# Patient Record
Sex: Female | Born: 1985 | Race: Black or African American | Hispanic: No | Marital: Single | Smoking: Current every day smoker
Health system: Southern US, Community
[De-identification: ages and names within clinical notes are randomized; demographics above are authoritative.]

---
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
## 2007-03-22 ENCOUNTER — Emergency Department (HOSPITAL_COMMUNITY): Admission: EM | Admit: 2007-03-22 | Discharge: 2007-03-23 | Payer: Self-pay | Admitting: Emergency Medicine

## 2007-07-26 ENCOUNTER — Ambulatory Visit (HOSPITAL_COMMUNITY): Admission: RE | Admit: 2007-07-26 | Discharge: 2007-07-26 | Payer: Self-pay | Admitting: Obstetrics & Gynecology

## 2007-09-20 ENCOUNTER — Ambulatory Visit (HOSPITAL_COMMUNITY): Admission: RE | Admit: 2007-09-20 | Discharge: 2007-09-20 | Payer: Self-pay | Admitting: Family Medicine

## 2007-09-29 ENCOUNTER — Inpatient Hospital Stay (HOSPITAL_COMMUNITY): Admission: AD | Admit: 2007-09-29 | Discharge: 2007-10-01 | Payer: Self-pay | Admitting: Gynecology

## 2007-09-29 ENCOUNTER — Ambulatory Visit: Payer: Self-pay | Admitting: Obstetrics and Gynecology

## 2008-09-17 ENCOUNTER — Emergency Department (HOSPITAL_COMMUNITY): Admission: EM | Admit: 2008-09-17 | Discharge: 2008-09-18 | Payer: Self-pay | Admitting: Emergency Medicine

## 2009-03-23 ENCOUNTER — Inpatient Hospital Stay (HOSPITAL_COMMUNITY): Admission: AD | Admit: 2009-03-23 | Discharge: 2009-03-23 | Payer: Self-pay | Admitting: Obstetrics & Gynecology

## 2009-07-05 ENCOUNTER — Inpatient Hospital Stay (HOSPITAL_COMMUNITY): Admission: AD | Admit: 2009-07-05 | Discharge: 2009-07-05 | Payer: Self-pay | Admitting: Obstetrics & Gynecology

## 2009-08-06 ENCOUNTER — Ambulatory Visit (HOSPITAL_COMMUNITY): Admission: RE | Admit: 2009-08-06 | Discharge: 2009-08-06 | Payer: Self-pay | Admitting: Family Medicine

## 2009-08-27 ENCOUNTER — Ambulatory Visit (HOSPITAL_COMMUNITY): Admission: RE | Admit: 2009-08-27 | Discharge: 2009-08-27 | Payer: Self-pay | Admitting: Obstetrics & Gynecology

## 2009-10-24 ENCOUNTER — Inpatient Hospital Stay (HOSPITAL_COMMUNITY): Admission: AD | Admit: 2009-10-24 | Discharge: 2009-10-24 | Payer: Self-pay | Admitting: Obstetrics & Gynecology

## 2009-10-24 ENCOUNTER — Ambulatory Visit: Payer: Self-pay | Admitting: Obstetrics and Gynecology

## 2009-11-03 ENCOUNTER — Inpatient Hospital Stay (HOSPITAL_COMMUNITY): Admission: AD | Admit: 2009-11-03 | Discharge: 2009-11-05 | Payer: Self-pay | Admitting: Obstetrics & Gynecology

## 2009-11-03 ENCOUNTER — Ambulatory Visit: Payer: Self-pay | Admitting: Family Medicine

## 2010-09-01 ENCOUNTER — Emergency Department (HOSPITAL_COMMUNITY): Admission: EM | Admit: 2010-09-01 | Discharge: 2010-09-01 | Payer: Self-pay | Admitting: Emergency Medicine

## 2010-11-17 ENCOUNTER — Emergency Department (HOSPITAL_COMMUNITY): Admission: EM | Admit: 2010-11-17 | Discharge: 2010-10-03 | Payer: Self-pay | Admitting: Emergency Medicine

## 2011-02-22 LAB — POCT PREGNANCY, URINE: Preg Test, Ur: NEGATIVE

## 2011-02-22 LAB — URINALYSIS, ROUTINE W REFLEX MICROSCOPIC
Bilirubin Urine: NEGATIVE
Glucose, UA: NEGATIVE mg/dL
Hgb urine dipstick: NEGATIVE
Ketones, ur: NEGATIVE mg/dL
Nitrite: NEGATIVE
Protein, ur: NEGATIVE mg/dL
Specific Gravity, Urine: 1.017 (ref 1.005–1.030)
Urobilinogen, UA: 1 mg/dL (ref 0.0–1.0)
pH: 5.5 (ref 5.0–8.0)

## 2011-02-22 LAB — RPR: RPR Ser Ql: NONREACTIVE

## 2011-02-22 LAB — GC/CHLAMYDIA PROBE AMP, GENITAL
Chlamydia, DNA Probe: NEGATIVE
GC Probe Amp, Genital: NEGATIVE

## 2011-02-22 LAB — WET PREP, GENITAL: Yeast Wet Prep HPF POC: NONE SEEN

## 2011-02-23 LAB — HEPATIC FUNCTION PANEL
ALT: 21 U/L (ref 0–35)
AST: 21 U/L (ref 0–37)
Bilirubin, Direct: 0.2 mg/dL (ref 0.0–0.3)
Indirect Bilirubin: 0.4 mg/dL (ref 0.3–0.9)
Total Protein: 7.5 g/dL (ref 6.0–8.3)

## 2011-02-23 LAB — DIFFERENTIAL
Basophils Absolute: 0 10*3/uL (ref 0.0–0.1)
Basophils Relative: 1 % (ref 0–1)
Eosinophils Absolute: 0.1 10*3/uL (ref 0.0–0.7)
Eosinophils Relative: 2 % (ref 0–5)
Lymphocytes Relative: 39 % (ref 12–46)
Lymphs Abs: 2 10*3/uL (ref 0.7–4.0)
Monocytes Absolute: 0.3 10*3/uL (ref 0.1–1.0)
Monocytes Relative: 5 % (ref 3–12)
Neutro Abs: 2.8 10*3/uL (ref 1.7–7.7)
Neutrophils Relative %: 53 % (ref 43–77)

## 2011-02-23 LAB — CBC
HCT: 39.6 % (ref 36.0–46.0)
Hemoglobin: 13.3 g/dL (ref 12.0–15.0)
MCH: 29.1 pg (ref 26.0–34.0)
MCHC: 33.6 g/dL (ref 30.0–36.0)
MCV: 86.7 fL (ref 78.0–100.0)
Platelets: 224 10*3/uL (ref 150–400)
RBC: 4.57 MIL/uL (ref 3.87–5.11)
RDW: 13.2 % (ref 11.5–15.5)
WBC: 5.2 10*3/uL (ref 4.0–10.5)

## 2011-02-23 LAB — BASIC METABOLIC PANEL
BUN: 10 mg/dL (ref 6–23)
CO2: 28 mEq/L (ref 19–32)
Calcium: 9.5 mg/dL (ref 8.4–10.5)
Chloride: 106 mEq/L (ref 96–112)
Creatinine, Ser: 0.65 mg/dL (ref 0.4–1.2)
GFR calc Af Amer: >60 mL/min (ref >60)
GFR calc non Af Amer: >60 mL/min (ref >60)
Glucose, Bld: 85 mg/dL (ref 70–99)
Potassium: 3.9 mEq/L (ref 3.5–5.1)
Sodium: 140 mEq/L (ref 135–145)

## 2011-02-23 LAB — URINE MICROSCOPIC-ADD ON

## 2011-02-23 LAB — URINALYSIS, ROUTINE W REFLEX MICROSCOPIC
Bilirubin Urine: NEGATIVE
Glucose, UA: NEGATIVE mg/dL
Hgb urine dipstick: NEGATIVE
Ketones, ur: NEGATIVE mg/dL
Nitrite: NEGATIVE
Protein, ur: NEGATIVE mg/dL
Specific Gravity, Urine: 1.027 (ref 1.005–1.030)
Urobilinogen, UA: 1 mg/dL (ref 0.0–1.0)
pH: 5.5 (ref 5.0–8.0)

## 2011-02-23 LAB — POCT PREGNANCY, URINE: Preg Test, Ur: NEGATIVE

## 2011-03-15 LAB — WET PREP, GENITAL
Clue Cells Wet Prep HPF POC: NONE SEEN
Trich, Wet Prep: NONE SEEN

## 2011-03-15 LAB — CBC
MCHC: 33.3 g/dL (ref 30.0–36.0)
RBC: 4.25 MIL/uL (ref 3.87–5.11)
RDW: 14.4 % (ref 11.5–15.5)

## 2011-03-15 LAB — RPR: RPR Ser Ql: NONREACTIVE

## 2011-03-15 LAB — RAPID HIV SCREEN (WH-MAU): Rapid HIV Screen: NONREACTIVE

## 2011-03-19 LAB — URINALYSIS, ROUTINE W REFLEX MICROSCOPIC
Bilirubin Urine: NEGATIVE
Ketones, ur: NEGATIVE mg/dL
Nitrite: NEGATIVE
Protein, ur: NEGATIVE mg/dL
Urobilinogen, UA: 0.2 mg/dL (ref 0.0–1.0)

## 2011-03-19 LAB — GC/CHLAMYDIA PROBE AMP, GENITAL: Chlamydia, DNA Probe: POSITIVE — AB

## 2011-03-22 LAB — URINALYSIS, ROUTINE W REFLEX MICROSCOPIC
Glucose, UA: NEGATIVE mg/dL
Ketones, ur: NEGATIVE mg/dL
Nitrite: NEGATIVE
Specific Gravity, Urine: >1.03 — ABNORMAL HIGH (ref 1.005–1.030)
pH: 6 (ref 5.0–8.0)

## 2011-03-22 LAB — WET PREP, GENITAL: Yeast Wet Prep HPF POC: NONE SEEN

## 2011-03-22 LAB — RPR: RPR Ser Ql: NONREACTIVE

## 2011-03-22 LAB — CBC
Hemoglobin: 12.1 g/dL (ref 12.0–15.0)
RBC: 3.97 MIL/uL (ref 3.87–5.11)
RDW: 12.6 % (ref 11.5–15.5)
WBC: 4.9 10*3/uL (ref 4.0–10.5)

## 2011-06-09 IMAGING — US US OB DETAIL+14 WK
2 series · 14 of 28 positions shown · non-contrast
Comparison: none

OBSTETRICAL ULTRASOUND:
 This ultrasound exam was performed in the [HOSPITAL] Ultrasound Department.  The OB US report was generated in the AS system, and faxed to the ordering physician.  This report is also available in [REDACTED] PACS.

[Series 1: us ob detail +14 wk · 1 of 7 slices shown (1 of 2)]
[im 7/7]
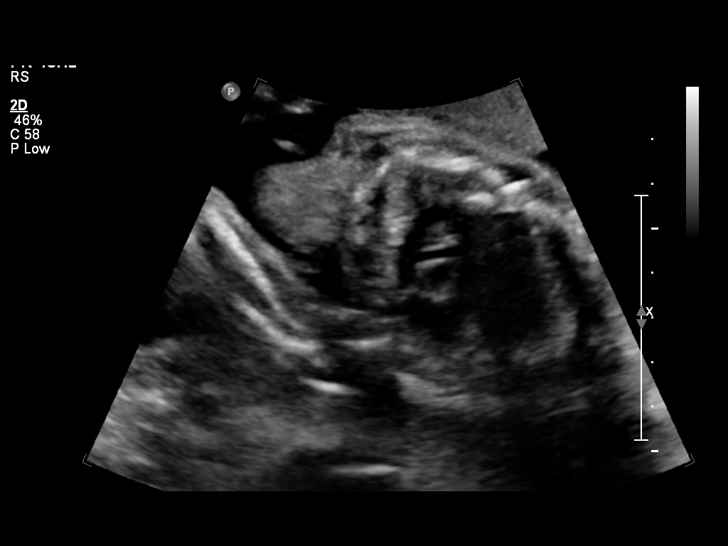

[Series 1: us ob detail +14 wk · 13 of 74 slices shown (2 of 2)]
[im 3/74]
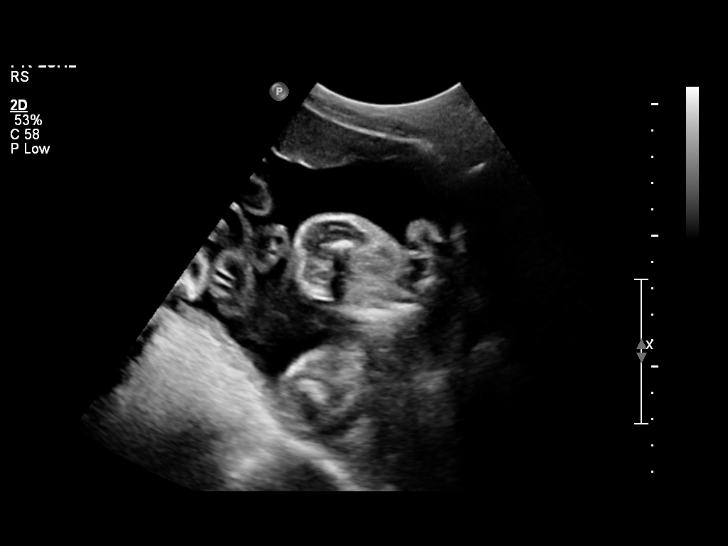
[im 9/74]
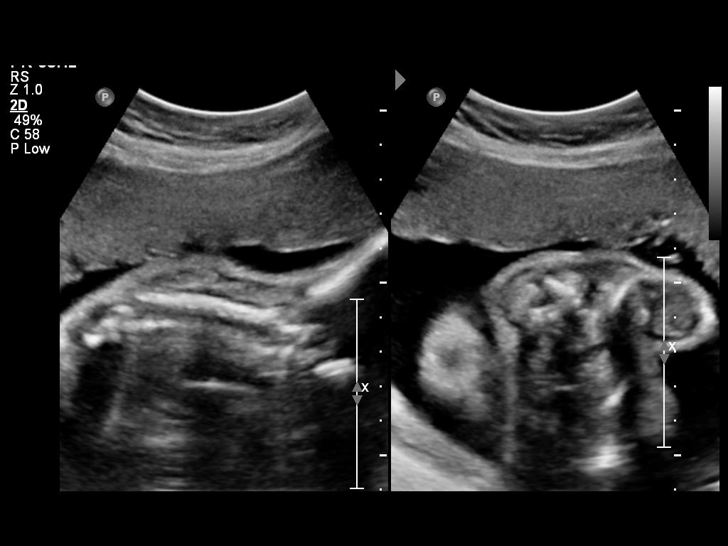
[im 15/74]
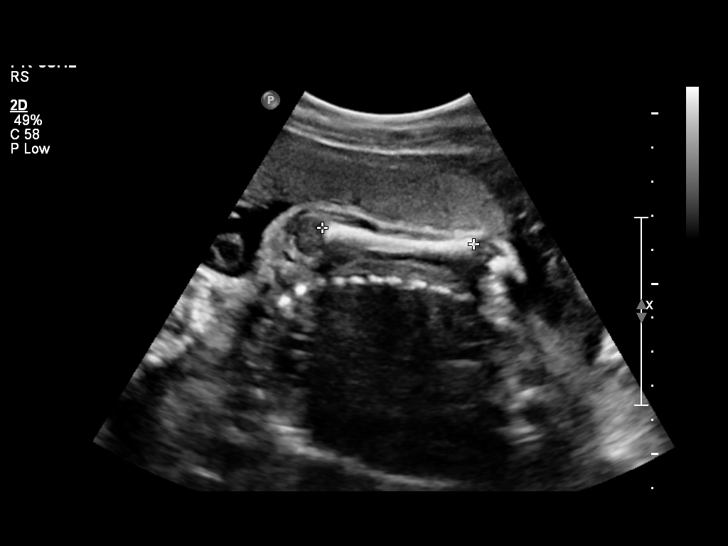
[im 21/74]
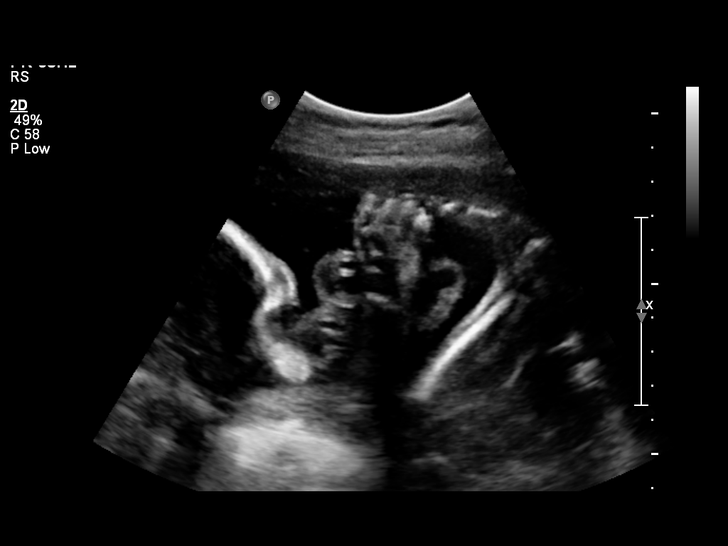
[im 27/74]
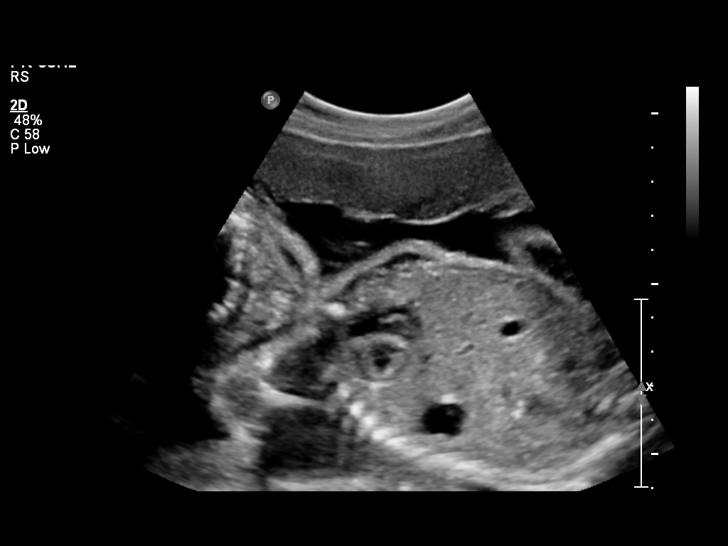
[im 33/74]
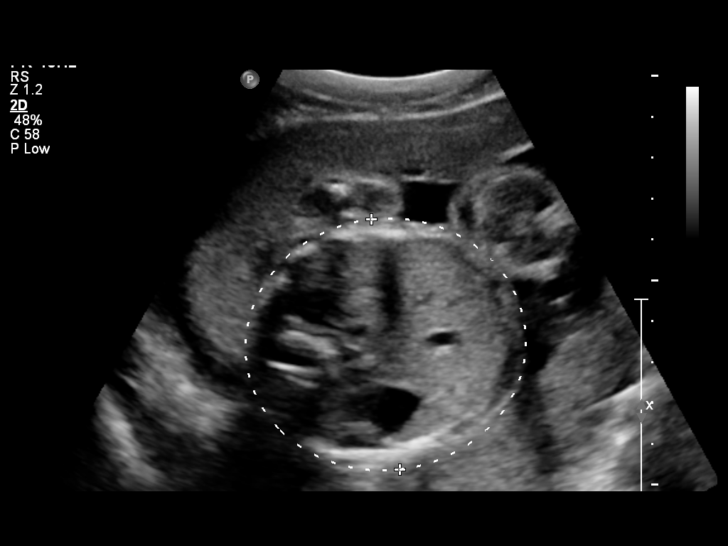
[im 38/74]
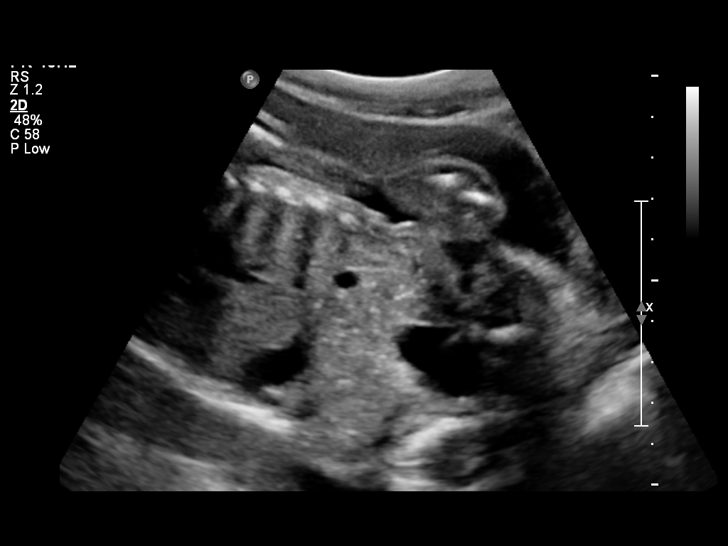
[im 44/74]
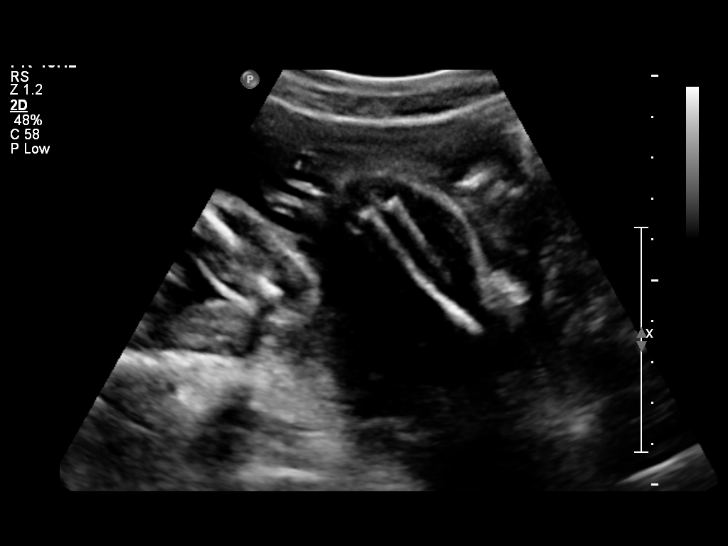
[im 50/74]
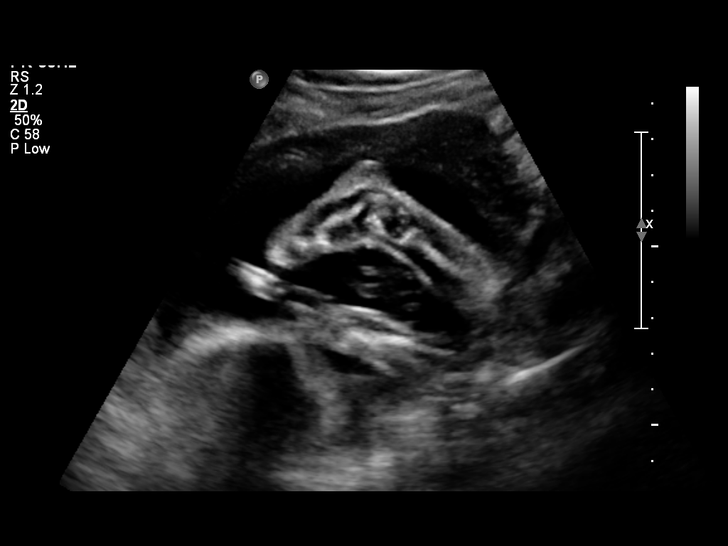
[im 56/74]
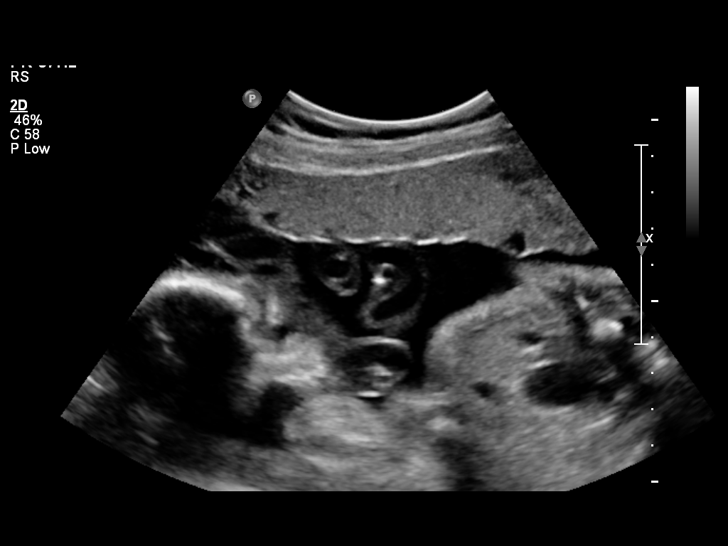
[im 62/74]
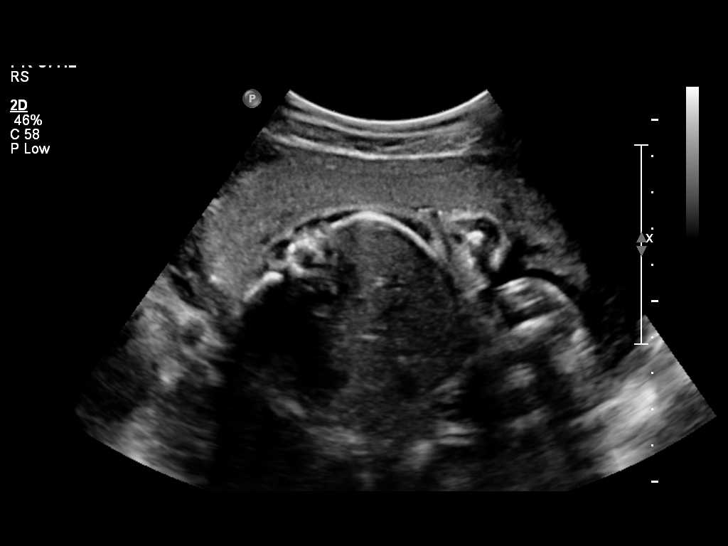
[im 68/74]
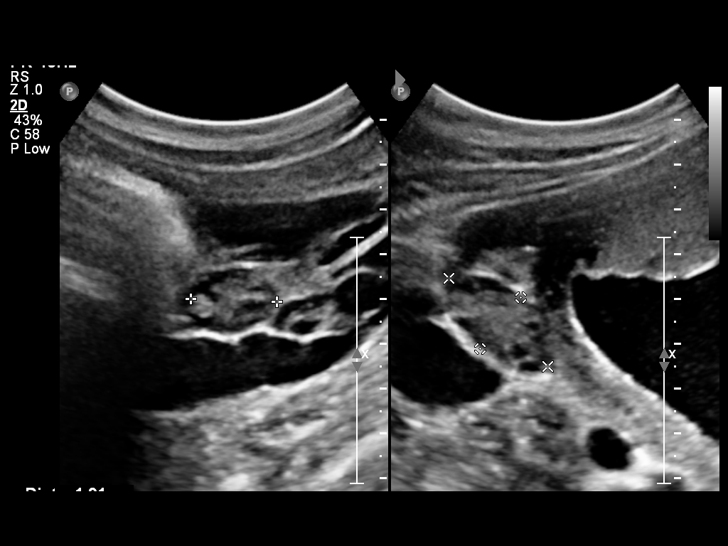
[im 74/74]
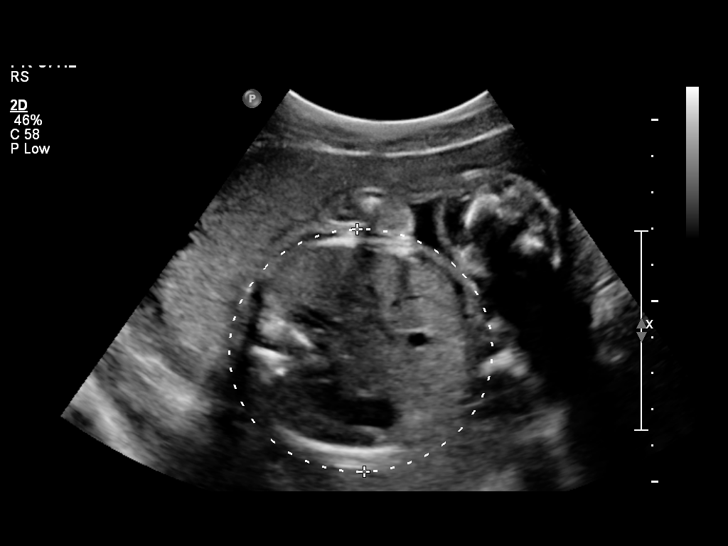

[14 of 28 positions shown; findings below may reference images not displayed]

IMPRESSION: See AS Obstetric US report.

## 2011-09-12 LAB — URINALYSIS, ROUTINE W REFLEX MICROSCOPIC
Bilirubin Urine: NEGATIVE
Glucose, UA: NEGATIVE
Nitrite: NEGATIVE
Specific Gravity, Urine: 1.028
pH: 5.5

## 2011-09-12 LAB — POCT PREGNANCY, URINE: Preg Test, Ur: NEGATIVE

## 2011-09-20 LAB — RAPID URINE DRUG SCREEN, HOSP PERFORMED
Barbiturates: NOT DETECTED
Benzodiazepines: NOT DETECTED
Cocaine: NOT DETECTED

## 2011-09-20 LAB — CBC
Hemoglobin: 12.8
MCHC: 33.7
MCHC: 34.7
MCV: 88
Platelets: 200
RBC: 4.24
RDW: 14

## 2011-09-20 LAB — CCBB MATERNAL DONOR DRAW

## 2011-09-20 LAB — RPR: RPR Ser Ql: NONREACTIVE

## 2011-11-27 ENCOUNTER — Emergency Department (HOSPITAL_COMMUNITY)
Admission: EM | Admit: 2011-11-27 | Discharge: 2011-11-28 | Disposition: A | Discharge status: Home / Self Care | Payer: Medicaid Other | Attending: Emergency Medicine | Admitting: Emergency Medicine

## 2011-11-27 ENCOUNTER — Encounter: Payer: Self-pay | Admitting: Emergency Medicine

## 2011-11-27 DIAGNOSIS — R109 Unspecified abdominal pain: Secondary | ICD-10-CM | POA: Insufficient documentation

## 2011-11-27 DIAGNOSIS — R0609 Other forms of dyspnea: Secondary | ICD-10-CM | POA: Insufficient documentation

## 2011-11-27 DIAGNOSIS — N949 Unspecified condition associated with female genital organs and menstrual cycle: Secondary | ICD-10-CM | POA: Insufficient documentation

## 2011-11-27 DIAGNOSIS — R10819 Abdominal tenderness, unspecified site: Secondary | ICD-10-CM | POA: Insufficient documentation

## 2011-11-27 DIAGNOSIS — R300 Dysuria: Secondary | ICD-10-CM | POA: Insufficient documentation

## 2011-11-27 DIAGNOSIS — A5901 Trichomonal vulvovaginitis: Secondary | ICD-10-CM | POA: Insufficient documentation

## 2011-11-27 DIAGNOSIS — R350 Frequency of micturition: Secondary | ICD-10-CM | POA: Insufficient documentation

## 2011-11-27 DIAGNOSIS — R3915 Urgency of urination: Secondary | ICD-10-CM | POA: Insufficient documentation

## 2011-11-27 DIAGNOSIS — R0989 Other specified symptoms and signs involving the circulatory and respiratory systems: Secondary | ICD-10-CM | POA: Insufficient documentation

## 2011-11-27 DIAGNOSIS — N72 Inflammatory disease of cervix uteri: Secondary | ICD-10-CM | POA: Insufficient documentation

## 2011-11-27 LAB — DIFFERENTIAL
Eosinophils Absolute: 0.2 10*3/uL (ref 0.0–0.7)
Eosinophils Relative: 3 % (ref 0–5)
Lymphs Abs: 2.7 10*3/uL (ref 0.7–4.0)
Monocytes Absolute: 0.4 10*3/uL (ref 0.1–1.0)
Monocytes Relative: 6 % (ref 3–12)

## 2011-11-27 LAB — BASIC METABOLIC PANEL
BUN: 14 mg/dL (ref 6–23)
Calcium: 8.8 mg/dL (ref 8.4–10.5)
Creatinine, Ser: 0.66 mg/dL (ref 0.50–1.10)
GFR calc non Af Amer: >90 mL/min (ref >90)
Glucose, Bld: 77 mg/dL (ref 70–99)

## 2011-11-27 LAB — URINALYSIS, ROUTINE W REFLEX MICROSCOPIC
Bilirubin Urine: NEGATIVE
Hgb urine dipstick: NEGATIVE
Protein, ur: NEGATIVE mg/dL
Urobilinogen, UA: 1 mg/dL (ref 0.0–1.0)

## 2011-11-27 LAB — CBC
MCHC: 33.8 g/dL (ref 30.0–36.0)
RDW: 13.1 % (ref 11.5–15.5)

## 2011-11-27 LAB — URINE MICROSCOPIC-ADD ON

## 2011-11-27 NOTE — ED Notes (Addendum)
Pt stated that she has been having abdominal cramping in lower and right side since 11/24/11. Pain score is 7 out of 10. No nausea or vomiting. Sexually active, but no pain during sex. No pain with urination. No fever. Bowel sounds present. Tenderness in all lower quadrants. Abdomen soft to touch. Pt stated that she was having vaginal itching  That stopped. Pt stated that she is now having vaginal discharge.

## 2011-11-27 NOTE — ED Notes (Signed)
PT. REPORTS LOW ABDOMINAL PAIN FOR 2 DAYS ,  DENIES VOMITTING OR DIARRHEA ,  NO FEVER OR CHILLS , DENIES DYSURIA.

## 2011-11-28 LAB — WET PREP, GENITAL: Clue Cells Wet Prep HPF POC: NONE SEEN

## 2011-11-28 MED ORDER — ONDANSETRON 4 MG PO TBDP
4.0000 mg | ORAL_TABLET | Freq: Once | ORAL | Status: AC
  Administered 2011-11-28: 4 mg via ORAL
  Filled 2011-11-28: qty 1

## 2011-11-28 MED ORDER — AZITHROMYCIN 250 MG PO TABS
1000.0000 mg | ORAL_TABLET | Freq: Once | ORAL | Status: AC
  Administered 2011-11-28: 1000 mg via ORAL
  Filled 2011-11-28: qty 4

## 2011-11-28 MED ORDER — FAMOTIDINE 20 MG PO TABS
20.0000 mg | ORAL_TABLET | Freq: Once | ORAL | Status: AC
  Administered 2011-11-28: 20 mg via ORAL

## 2011-11-28 MED ORDER — CEFTRIAXONE SODIUM 250 MG IJ SOLR
250.0000 mg | Freq: Once | INTRAMUSCULAR | Status: AC
  Administered 2011-11-28: 250 mg via INTRAMUSCULAR
  Filled 2011-11-28: qty 250

## 2011-11-28 MED ORDER — METRONIDAZOLE 500 MG PO TABS
2000.0000 mg | ORAL_TABLET | Freq: Once | ORAL | Status: AC
  Administered 2011-11-28: 2000 mg via ORAL
  Filled 2011-11-28: qty 4

## 2011-11-28 MED ORDER — LIDOCAINE HCL 2 % IJ SOLN
INTRAMUSCULAR | Status: AC
  Administered 2011-11-28: 1 mL via INTRAMUSCULAR
  Filled 2011-11-28: qty 1

## 2011-11-28 MED ORDER — FAMOTIDINE 20 MG PO TABS
ORAL_TABLET | ORAL | Status: AC
  Administered 2011-11-28: 20 mg via ORAL
  Filled 2011-11-28: qty 1

## 2011-11-28 NOTE — ED Notes (Signed)
Pt complaining that her lip was swollen after using the phone. NP Claris Gladden made aware with no new orders. Will continue to monitor.

## 2011-11-28 NOTE — ED Provider Notes (Signed)
History     CSN: [id number] Arrival date & time: 11/27/2011  9:19 PM   None     Chief Complaint  Patient presents with  . Abdominal Pain    (Consider location/radiation/quality/duration/timing/severity/associated sxs/prior treatment) HPI Comments: Patient with bilateral lower and suprapubic pain on and off for about one week.  Has a positive vaginal discharge and dysuria  Patient is a 25 y.o. female presenting with abdominal pain. The history is provided by the patient.  Abdominal Pain The primary symptoms of the illness include abdominal pain and vaginal discharge. The primary symptoms of the illness do not include nausea, vomiting, diarrhea or dysuria. The current episode started more than 2 days ago. The onset of the illness was gradual. The problem has been gradually worsening.  The vaginal discharge is not associated with dysuria.  Additional symptoms associated with the illness include urgency and frequency. Symptoms associated with the illness do not include chills, constipation or hematuria.    History reviewed. No pertinent past medical history.  History reviewed. No pertinent past surgical history.  No family history on file.  History  Substance Use Topics  . Smoking status: Never Smoker   . Smokeless tobacco: Not on file  . Alcohol Use: No    OB History    Grav Para Term Preterm Abortions TAB SAB Ect Mult Living                  Review of Systems  Constitutional: Negative for chills.  HENT: Negative.   Eyes: Negative.   Respiratory: Negative.   Gastrointestinal: Positive for abdominal pain. Negative for nausea, vomiting, diarrhea and constipation.  Genitourinary: Positive for urgency, frequency, vaginal discharge and vaginal pain. Negative for dysuria, hematuria, menstrual problem and pelvic pain.  Neurological: Negative.   Hematological: Negative.   Psychiatric/Behavioral: Negative.     Allergies  Review of patient's allergies indicates no known  allergies.  Home Medications   Current Outpatient Rx  Name Route Sig Dispense Refill  . NAPROXEN SODIUM 220 MG PO TABS Oral Take 220 mg by mouth once as needed. For pain.       BP 115/68  Pulse 71  Temp(Src) 98.3 F (36.8 C) (Oral)  Resp 18  SpO2 99%  LMP 10/26/2011  Physical Exam  Constitutional: She is oriented to person, place, and time. She appears well-developed and well-nourished.  HENT:  Head: Normocephalic.  Neck: Normal range of motion.  Cardiovascular: Normal rate.   Pulmonary/Chest: She is in respiratory distress.  Abdominal: There is tenderness in the suprapubic area. There is no CVA tenderness.  Genitourinary: There is tenderness around the vagina. Vaginal discharge found.  Musculoskeletal: Normal range of motion.  Neurological: She is oriented to person, place, and time.  Skin: Skin is warm.    ED Course  Procedures (including critical care time)  Labs Reviewed  BASIC METABOLIC PANEL - Abnormal; Notable for the following:    Sodium 134 (*)    Potassium 3.3 (*)    All other components within normal limits  URINALYSIS, ROUTINE W REFLEX MICROSCOPIC - Abnormal; Notable for the following:    Specific Gravity, Urine 1.038 (*)    Leukocytes, UA SMALL (*)    All other components within normal limits  URINE MICROSCOPIC-ADD ON - Abnormal; Notable for the following:    Squamous Epithelial / LPF FEW (*)    All other components within normal limits  WET PREP, GENITAL - Abnormal; Notable for the following:    Trich, Wet Prep FEW (*)  All other components within normal limits  CBC  DIFFERENTIAL  POCT PREGNANCY, URINE  POCT PREGNANCY, URINE  GC/CHLAMYDIA PROBE AMP, GENITAL   No results found.   1. Acute cervicitis   2. Trichomonal vaginitis     At time of discharge.  Patient reports on lips are swollen.  This happened before any of the antibiotics were administered.  She states she was fine and so she used them in the room and they noticed 8, numbness,  tingling to her right upper lip that is now spread across the entire upper lip and the right hand side of the lower lid minister, Pepcid, by mouth and observed for short period of time.  On exam.  The swelling appears to be isolated to the external lips.  No trouble swallowing.  No tachycardia  MDM  We'll perform a pelvic exam on exam, patient is cervical motion tenderness and bilateral adnexal tenderness.  Will treat for potential STD after review of wet prep.  She is positive for, trichomonas we'll treat for that as well        Arman Filter, NP 11/28/11 4540  Arman Filter, NP 11/28/11 9811  Arman Filter, NP 11/28/11 [phone]

## 2011-11-28 NOTE — ED Provider Notes (Signed)
Medical screening examination/treatment/procedure(s) were performed by non-physician practitioner and as supervising physician I was immediately available for consultation/collaboration.  Raeford Razor, MD 11/28/11 2251

## 2011-11-28 NOTE — ED Notes (Addendum)
Pt complained that her lip was slightly swollen after talking on the phone earlier. NP was aware and no new medications were given. Lip was slightly edematous. Pt was being discharged and lip was slightly edematous. Pt  Pt came back to stretcher triage, because NT noticed swollen. When Pt came back lip was 2x larger than it was when pt was discharge. NP made aware. Pepcid was given. Will continue to monitor.

## 2011-11-30 LAB — GC/CHLAMYDIA PROBE AMP, GENITAL
Chlamydia, DNA Probe: POSITIVE — AB
GC Probe Amp, Genital: NEGATIVE

## 2011-12-01 NOTE — ED Notes (Addendum)
+   chlamydia Treated with Rocephin and Zithromax DHHS letter faxed

## 2011-12-06 NOTE — ED Notes (Signed)
Pt returned call.  Pt ID verified x 2.  Pt informed of dx, tx rcvd approp., need to notify partner(s) for testing and tx and abstain from sex x 2 wks.  Pt verbalizes understanding.

## 2012-09-17 ENCOUNTER — Encounter (HOSPITAL_COMMUNITY): Payer: Self-pay | Admitting: Emergency Medicine

## 2012-09-17 ENCOUNTER — Emergency Department (INDEPENDENT_AMBULATORY_CARE_PROVIDER_SITE_OTHER)
Admission: EM | Admit: 2012-09-17 | Discharge: 2012-09-17 | Disposition: A | Discharge status: Home / Self Care | Payer: Medicaid Other | Source: Home / Self Care | Attending: Family Medicine | Admitting: Family Medicine

## 2012-09-17 DIAGNOSIS — K0889 Other specified disorders of teeth and supporting structures: Secondary | ICD-10-CM

## 2012-09-17 DIAGNOSIS — K047 Periapical abscess without sinus: Secondary | ICD-10-CM

## 2012-09-17 DIAGNOSIS — N898 Other specified noninflammatory disorders of vagina: Secondary | ICD-10-CM

## 2012-09-17 LAB — POCT URINALYSIS DIP (DEVICE)
Ketones, ur: NEGATIVE mg/dL
Leukocytes, UA: NEGATIVE
Nitrite: NEGATIVE
Protein, ur: NEGATIVE mg/dL
pH: 5.5 (ref 5.0–8.0)

## 2012-09-17 LAB — WET PREP, GENITAL

## 2012-09-17 MED ORDER — TRAMADOL HCL 50 MG PO TABS: 50.0000 mg | ORAL_TABLET | Freq: Four times a day (QID) | ORAL | Status: DC | PRN

## 2012-09-17 MED ORDER — PENICILLIN V POTASSIUM 500 MG PO TABS: 500.0000 mg | ORAL_TABLET | Freq: Three times a day (TID) | ORAL | Status: DC

## 2012-09-17 NOTE — ED Provider Notes (Addendum)
History     CSN: [id number]  Arrival date & time 09/17/12  1335   None     Chief Complaint  Patient presents with  . Vaginal Discharge  . Dental Pain    (Consider location/radiation/quality/duration/timing/severity/associated sxs/prior treatment) Patient is a 26 y.o. female presenting with vaginal discharge and tooth pain. The history is provided by the patient.  Vaginal Discharge Associated symptoms include abdominal pain.  Dental Pain  Right lower tooth pain for 7 days, 2nd tooth from the molar. Felt a crack while chewing.   Has taken home ibuprofen for pain without any improvement. Denies any dentist at this present moment.  +Thermal sensitivity No fever No gingival bleeding No decreased intake of food/fluid  26 y.o. female complains of whitish, foul odor vaginal discharge for 15 days. Denies abnormal vaginal bleeding. + significant pelvic pain or fever. Dysuria, no hematuria. Sexually active, does not use condoms,  No change in partner but found out partner was cheating on her. Last unprotected intercourse 15 days ago.  No history of known exposure to STD ; but hx of Chlamydia (Dec 2012).  No LMP recorded. Patient is not currently having periods (Reason: IUD)., on Mirena. Last Pap test: abnormal; colposcopy due.  History reviewed. No pertinent past medical history.  History reviewed. No pertinent past surgical history.  No family history on file.  History  Substance Use Topics  . Smoking status: Current Every Day Smoker -- 0.5 packs/day    Types: Cigarettes  . Smokeless tobacco: Not on file  . Alcohol Use: No    OB History    Grav Para Term Preterm Abortions TAB SAB Ect Mult Living                  Review of Systems  Constitutional: Negative.   Respiratory: Negative.   Cardiovascular: Negative.   Gastrointestinal: Positive for abdominal pain. Negative for nausea, vomiting and constipation.  Genitourinary: Positive for vaginal discharge. Negative for  dysuria, frequency, hematuria, flank pain, decreased urine volume, vaginal bleeding and menstrual problem.  Skin: Negative.     Allergies  Review of patient's allergies indicates no known allergies.  Home Medications   Current Outpatient Rx  Name Route Sig Dispense Refill  . IBUPROFEN 400 MG PO TABS Oral Take 400 mg by mouth every 6 (six) hours as needed.    Marland Kitchen METRONIDAZOLE 500 MG PO TABS Oral Take 1 tablet (500 mg total) by mouth 2 (two) times daily. 14 tablet 0  . NAPROXEN SODIUM 220 MG PO TABS Oral Take 220 mg by mouth once as needed. For pain.     Marland Kitchen PENICILLIN V POTASSIUM 500 MG PO TABS Oral Take 1 tablet (500 mg total) by mouth 3 (three) times daily. 30 tablet 0  . TRAMADOL HCL 50 MG PO TABS Oral Take 1 tablet (50 mg total) by mouth every 6 (six) hours as needed for pain. 15 tablet 0    BP 112/81  Pulse 68  Temp 98.9 F (37.2 C) (Oral)  Resp 16  SpO2 99%  Physical Exam  Nursing note and vitals reviewed. Constitutional: She is oriented to person, place, and time. Vital signs are normal. She appears well-developed and well-nourished. She is active and cooperative.  HENT:  Head: Normocephalic. No trismus in the jaw.  Right Ear: Hearing, tympanic membrane, external ear and ear canal normal.  Left Ear: Hearing, tympanic membrane, external ear and ear canal normal.  Nose: Nose normal. Right sinus exhibits no maxillary sinus tenderness and no  frontal sinus tenderness. Left sinus exhibits no maxillary sinus tenderness and no frontal sinus tenderness.  Mouth/Throat: Uvula is midline, oropharynx is clear and moist and mucous membranes are normal. Abnormal dentition. Dental abscesses and dental caries present. No oropharyngeal exudate, posterior oropharyngeal edema or posterior oropharyngeal erythema.    Eyes: Conjunctivae normal are normal. Pupils are equal, round, and reactive to light. No scleral icterus.  Neck: Trachea normal, normal range of motion and full passive range of motion  without pain. Neck supple. No muscular tenderness present. No thyromegaly present.  Cardiovascular: Normal rate, regular rhythm, normal heart sounds, intact distal pulses and normal pulses.   Pulmonary/Chest: Effort normal and breath sounds normal.  Abdominal: Soft. Bowel sounds are normal. There is tenderness in the suprapubic area. There is no CVA tenderness.  Genitourinary: Uterus normal. Pelvic exam was performed with patient supine. There is no rash, tenderness, lesion or injury on the right labia. There is no rash, tenderness, lesion or injury on the left labia. Cervix exhibits no motion tenderness, no discharge and no friability. Right adnexum displays no mass, no tenderness and no fullness. Left adnexum displays no mass, no tenderness and no fullness. No erythema, tenderness or bleeding around the vagina. No foreign body around the vagina. No signs of injury around the vagina. Vaginal discharge found.    Lymphadenopathy:    She has no cervical adenopathy.       Right: No inguinal adenopathy present.       Left: Inguinal adenopathy present.  Neurological: She is alert and oriented to person, place, and time. No cranial nerve deficit or sensory deficit. GCS eye subscore is 4. GCS verbal subscore is 5. GCS motor subscore is 6.  Skin: Skin is warm and dry. No rash noted.  Psychiatric: She has a normal mood and affect. Her speech is normal and behavior is normal. Judgment and thought content normal. Cognition and memory are normal.    ED Course  Procedures (including critical care time)  Labs Reviewed  POCT URINALYSIS DIP (DEVICE) - Abnormal; Notable for the following:    Hgb urine dipstick SMALL (*)     All other components within normal limits  WET PREP, GENITAL - Abnormal; Notable for the following:    Clue Cells Wet Prep HPF POC MANY (*)     WBC, Wet Prep HPF POC FEW (*)     All other components within normal limits  POCT PREGNANCY, URINE  GC/CHLAMYDIA PROBE AMP, GENITAL   No  results found.   1. Vaginal discharge   2. Pain, dental   3. Dental abscess       MDM  Await wet prep and gc cultures.  Condoms for STD prevention.  Follow up with gyn provider for colposcopy.         Johnsie Kindred, NP 09/17/12 1606  Johnsie Kindred, NP 09/18/12 1317

## 2012-09-17 NOTE — ED Provider Notes (Signed)
Medical screening examination/treatment/procedure(s) were performed by resident physician or non-physician practitioner and as supervising physician I was immediately available for consultation/collaboration.   Barkley Bruns MD.    Linna Hoff, MD 09/17/12 2053

## 2012-09-17 NOTE — ED Notes (Signed)
Right jaw swelling and pain x 1 week. Also has white vaginal discharge with odor x 2 weeks.

## 2012-09-17 NOTE — ED Notes (Signed)
Pt's phone number verified for call back with results.

## 2012-09-17 NOTE — ED Notes (Signed)
Discharge instructions not yet available. 

## 2012-09-18 ENCOUNTER — Telehealth (HOSPITAL_COMMUNITY): Payer: Self-pay | Admitting: Emergency Medicine

## 2012-09-18 LAB — GC/CHLAMYDIA PROBE AMP, GENITAL
Chlamydia, DNA Probe: NEGATIVE
GC Probe Amp, Genital: NEGATIVE

## 2012-09-18 MED ORDER — METRONIDAZOLE 500 MG PO TABS: 500.0000 mg | ORAL_TABLET | Freq: Two times a day (BID) | ORAL | Status: DC

## 2012-09-18 NOTE — ED Notes (Addendum)
GC/Chlamydia neg., Wet prep: Many clue cells, few WBC's.  [name] e-prescribed Flagyl to [facility] on [address]. She called pt., but no answer. Audrey Simpson 09/18/2012

## 2012-09-19 ENCOUNTER — Telehealth (HOSPITAL_COMMUNITY): Payer: Self-pay | Admitting: *Deleted

## 2012-09-19 NOTE — ED Notes (Signed)
Pt. called back.  Pt. verified x 2 and given results.  Pt. told she needs a Rx. of Flagyl. for bacterial vaginosis.  Pt. told it was sent to the [facility] on [address].   Pt. instructed to no alcohol while taking this medication. Pt. voiced understanding. Vassie Moselle 09/19/2012

## 2012-09-19 NOTE — ED Provider Notes (Signed)
Medical screening examination/treatment/procedure(s) were performed by resident physician or non-physician practitioner and as supervising physician I was immediately available for consultation/collaboration.   Khaleesi Gruel DOUGLAS MD.    Clydell Sposito D Shakia Sebastiano, MD 09/19/12 1931 

## 2012-10-29 ENCOUNTER — Ambulatory Visit (INDEPENDENT_AMBULATORY_CARE_PROVIDER_SITE_OTHER): Payer: Self-pay | Admitting: General Surgery

## 2012-11-12 ENCOUNTER — Encounter (INDEPENDENT_AMBULATORY_CARE_PROVIDER_SITE_OTHER): Payer: Self-pay | Admitting: General Surgery

## 2012-11-15 ENCOUNTER — Ambulatory Visit (INDEPENDENT_AMBULATORY_CARE_PROVIDER_SITE_OTHER): Payer: Self-pay | Admitting: General Surgery

## 2012-11-20 ENCOUNTER — Encounter (HOSPITAL_BASED_OUTPATIENT_CLINIC_OR_DEPARTMENT_OTHER): Payer: Medicaid Other

## 2012-12-12 ENCOUNTER — Encounter (INDEPENDENT_AMBULATORY_CARE_PROVIDER_SITE_OTHER): Payer: Self-pay | Admitting: General Surgery

## 2012-12-18 ENCOUNTER — Encounter (HOSPITAL_BASED_OUTPATIENT_CLINIC_OR_DEPARTMENT_OTHER): Payer: Medicaid Other

## 2013-02-15 ENCOUNTER — Encounter (HOSPITAL_COMMUNITY): Payer: Self-pay | Admitting: Emergency Medicine

## 2013-02-15 ENCOUNTER — Emergency Department (HOSPITAL_COMMUNITY)
Admission: EM | Admit: 2013-02-15 | Discharge: 2013-02-15 | Disposition: A | Discharge status: Home / Self Care | Payer: Medicaid Other | Source: Home / Self Care

## 2013-02-15 DIAGNOSIS — K089 Disorder of teeth and supporting structures, unspecified: Secondary | ICD-10-CM

## 2013-02-15 LAB — POCT URINALYSIS DIP (DEVICE)
Glucose, UA: NEGATIVE mg/dL
Leukocytes, UA: NEGATIVE
Protein, ur: NEGATIVE mg/dL
Specific Gravity, Urine: 1.025 (ref 1.005–1.030)
Urobilinogen, UA: 0.2 mg/dL (ref 0.0–1.0)

## 2013-02-15 LAB — POCT PREGNANCY, URINE: Preg Test, Ur: NEGATIVE

## 2013-02-15 MED ORDER — TRAMADOL-ACETAMINOPHEN 37.5-325 MG PO TABS: 1.0000 | ORAL_TABLET | Freq: Four times a day (QID) | ORAL | Status: DC | PRN

## 2013-02-15 MED ORDER — CEFTRIAXONE SODIUM 1 G IJ SOLR
1.0000 g | Freq: Once | INTRAMUSCULAR | Status: AC
  Administered 2013-02-15: 1 g via INTRAMUSCULAR

## 2013-02-15 MED ORDER — CEFTRIAXONE SODIUM 1 G IJ SOLR
INTRAMUSCULAR | Status: AC
  Filled 2013-02-15: qty 10

## 2013-02-15 MED ORDER — AMOXICILLIN-POT CLAVULANATE 875-125 MG PO TABS: 1.0000 | ORAL_TABLET | Freq: Two times a day (BID) | ORAL | Status: DC

## 2013-02-15 MED ORDER — LIDOCAINE HCL (PF) 1 % IJ SOLN
INTRAMUSCULAR | Status: AC
  Filled 2013-02-15: qty 5

## 2013-02-15 NOTE — ED Provider Notes (Signed)
History     CSN: [id number]  Arrival date & time 02/15/13  1432   First MD Initiated Contact with Patient 02/15/13 1557      Chief Complaint  Patient presents with  . Abscess    (Consider location/radiation/quality/duration/timing/severity/associated sxs/prior treatment) HPI Comments: 27 y o female with nodule on R side of face for 2 months. Has been increasing in size. Occurred a few weeks ago and sought tx in the ED, tx with PCN and resolved. C/o toothache, no fever. NOt seen a dentist   History reviewed. No pertinent past medical history.  History reviewed. No pertinent past surgical history.  Family History  Problem Relation Age of Onset  . Cancer Father     lung    History  Substance Use Topics  . Smoking status: Current Every Day Smoker -- 0.50 packs/day    Types: Cigarettes  . Smokeless tobacco: Not on file  . Alcohol Use: No    OB History   Grav Para Term Preterm Abortions TAB SAB Ect Mult Living                  Review of Systems  Constitutional: Negative.   HENT: Positive for facial swelling. Negative for congestion, sore throat, rhinorrhea, sneezing, neck pain and neck stiffness.   Eyes: Negative.   Gastrointestinal: Negative.   Musculoskeletal: Negative.   Skin: Negative.   Neurological: Negative.     Allergies  Review of patient's allergies indicates no known allergies.  Home Medications   Current Outpatient Rx  Name  Route  Sig  Dispense  Refill  . amoxicillin-clavulanate (AUGMENTIN) 875-125 MG per tablet   Oral   Take 1 tablet by mouth every 12 (twelve) hours.   14 tablet   0   . traMADol-acetaminophen (ULTRACET) 37.5-325 MG per tablet   Oral   Take 1 tablet by mouth every 6 (six) hours as needed for pain. Take 1-2 tablets every 6 hours as needed for pain   20 tablet   0     BP 121/77  Pulse 66  Temp(Src) 98.7 F (37.1 C) (Oral)  Resp 14  SpO2 96%  Physical Exam  Nursing note and vitals reviewed. Constitutional: She is  oriented to person, place, and time. She appears well-developed and well-nourished.  HENT:  Mouth/Throat: Oropharynx is clear and moist.  No dental tenderness, no swelling within the oral cavity. The oropharynx is without erythema, moist and clear. There is a 4 x 4 centimeter nodule over the right cheek overlying soft tissue opposite the right buccal mucosa. No overlying skin changes or lymphangitis. No erythema and no area of drainage.  Neck: Normal range of motion. Neck supple.  Cardiovascular: Normal rate.   Pulmonary/Chest: Effort normal.  Lymphadenopathy:    She has no cervical adenopathy.  Neurological: She is alert and oriented to person, place, and time. She exhibits normal muscle tone.  Skin: Skin is warm and dry.  Psychiatric: She has a normal mood and affect.    ED Course  Procedures (including critical care time)  Labs Reviewed  POCT URINALYSIS DIP (DEVICE) - Abnormal; Notable for the following:    Hgb urine dipstick SMALL (*)    All other components within normal limits  POCT PREGNANCY, URINE   No results found.   1. Facial abscess   2. Toothache       MDM  No apparent facial cellulitis. The nodule is most likely an abscess particularly in light of her history of the same.  She states she just got Medicaid and instructed to call a dentist Monday. Rocephin 1 g IM now Augmentin 875 one twice a day for 7 days apply warm compresses and call a dentist tomorrow. For any new symptoms problems or worsening may return          Hayden Rasmussen, NP 02/15/13 1711

## 2013-02-15 NOTE — ED Notes (Signed)
Patient getting into patient gown and equipment at bedside

## 2013-02-15 NOTE — ED Notes (Signed)
Abscess to right face at jaw

## 2013-02-16 NOTE — ED Provider Notes (Signed)
  I have directly reviewed the clinical findings, lab, imaging studies and management of this patient in detail. I  agree with the documentation,  as recorded by the Physician extender.  SINGH,PRASHANT K M.D on 02/16/2013 at 12:56 PM  [facility] Office  [phone] 

## 2013-07-19 ENCOUNTER — Emergency Department (HOSPITAL_COMMUNITY)
Admission: EM | Admit: 2013-07-19 | Discharge: 2013-07-19 | Disposition: A | Discharge status: Home / Self Care | Payer: Medicaid Other | Attending: Emergency Medicine | Admitting: Emergency Medicine

## 2013-07-19 ENCOUNTER — Encounter (HOSPITAL_COMMUNITY): Payer: Self-pay | Admitting: Emergency Medicine

## 2013-07-19 DIAGNOSIS — H9209 Otalgia, unspecified ear: Secondary | ICD-10-CM | POA: Insufficient documentation

## 2013-07-19 DIAGNOSIS — K0889 Other specified disorders of teeth and supporting structures: Secondary | ICD-10-CM

## 2013-07-19 DIAGNOSIS — K029 Dental caries, unspecified: Secondary | ICD-10-CM | POA: Insufficient documentation

## 2013-07-19 DIAGNOSIS — F17200 Nicotine dependence, unspecified, uncomplicated: Secondary | ICD-10-CM | POA: Insufficient documentation

## 2013-07-19 DIAGNOSIS — K089 Disorder of teeth and supporting structures, unspecified: Secondary | ICD-10-CM | POA: Insufficient documentation

## 2013-07-19 DIAGNOSIS — K0381 Cracked tooth: Secondary | ICD-10-CM | POA: Insufficient documentation

## 2013-07-19 MED ORDER — PENICILLIN V POTASSIUM 500 MG PO TABS: 500.0000 mg | ORAL_TABLET | Freq: Four times a day (QID) | ORAL | Status: AC

## 2013-07-19 MED ORDER — HYDROCODONE-ACETAMINOPHEN 5-325 MG PO TABS: 1.0000 | ORAL_TABLET | Freq: Four times a day (QID) | ORAL | Status: DC | PRN

## 2013-07-19 NOTE — ED Provider Notes (Signed)
CSN: [id number]     Arrival date & time 07/19/13  1618 History  This chart was scribed for non-physician practitioner, Magnus Sinning, PA-C working with Doug Sou, MD by Greggory Stallion, ED scribe. This patient was seen in room TR08C/TR08C and the patient's care was started at 4:55 PM.   Chief Complaint  Patient presents with  . Dental Pain   Patient is a 27 y.o. female presenting with tooth pain. The history is provided by the patient. No language interpreter was used.  Dental Pain Associated symptoms: no difficulty swallowing, no drooling, no facial swelling, no fever, no gum swelling, no neck pain, no neck swelling and no trismus     HPI Comments: Audrey Simpson is a 27 y.o. female who presents to the Emergency Department complaining of gradual onset, intermittent left lower dental pain that radiates to her left ear that started 3 days ago. She states she has a broken tooth on her left side. Pt states the tooth is sensitive to cold and heat. Pt has taken extra strength tylenol with temporary relief. Pt denies fever, chills and facial swelling as associated symptoms. She has a dentist appointment soon to get the cracked tooth removed.    History reviewed. No pertinent past medical history. History reviewed. No pertinent past surgical history. Family History  Problem Relation Age of Onset  . Cancer Father     lung   History  Substance Use Topics  . Smoking status: Current Every Day Smoker -- 0.50 packs/day    Types: Cigarettes  . Smokeless tobacco: Not on file  . Alcohol Use: No   OB History   Grav Para Term Preterm Abortions TAB SAB Ect Mult Living                 Review of Systems  Constitutional: Negative for fever and chills.  HENT: Positive for dental problem. Negative for facial swelling, drooling and neck pain.   All other systems reviewed and are negative.    Allergies  Review of patient's allergies indicates no known allergies.  Home Medications    Current Outpatient Rx  Name  Route  Sig  Dispense  Refill  . acetaminophen (TYLENOL) 500 MG tablet   Oral   Take 2,000 mg by mouth daily as needed for pain.         Marland Kitchen ibuprofen (ADVIL,MOTRIN) 200 MG tablet   Oral   Take 1,000 mg by mouth daily as needed for pain.          BP 109/68  Pulse 71  Temp(Src) 98.2 F (36.8 C) (Oral)  Resp 18  Ht 5\' 4"  (1.626 m)  Wt 110 lb (49.896 kg)  BMI 18.87 kg/m2  SpO2 98%  Physical Exam  Nursing note and vitals reviewed. Constitutional: She is oriented to person, place, and time. She appears well-developed and well-nourished. No distress.  HENT:  Head: Normocephalic and atraumatic.  Left lower molar tooth is partially decayed. No obvious abscess present. No trismus. No submandibular or submental lymphadenopathy. No sublingual tenderness.   Eyes: EOM are normal.  Neck: Normal range of motion. Neck supple. No tracheal deviation present.  Cardiovascular: Normal rate.   Pulmonary/Chest: Effort normal. No respiratory distress.  Musculoskeletal: Normal range of motion.  Neurological: She is alert and oriented to person, place, and time.  Skin: Skin is warm and dry.  Psychiatric: She has a normal mood and affect. Her behavior is normal.    ED Course   NERVE BLOCK Date/Time: 07/19/2013  5:24 PM Performed by: Anne Shutter, Kazandra Forstrom Authorized by: Anne Shutter, Herbert Seta Consent: Verbal consent obtained. Risks and benefits: risks, benefits and alternatives were discussed Consent given by: patient Patient understanding: patient states understanding of the procedure being performed Patient consent: the patient's understanding of the procedure matches consent given Patient identity confirmed: verbally with patient Indications: pain relief Body area: face/mouth Nerve: inferior alveolar Laterality: left Patient position: supine Needle gauge: 27 G Location technique: anatomical landmarks Local anesthetic: lidocaine 2% with epinephrine Outcome:  pain improved Patient tolerance: Patient tolerated the procedure well with no immediate complications.   (including critical care time)  DIAGNOSTIC STUDIES: Oxygen Saturation is 98% on RA, normal by my interpretation.    COORDINATION OF CARE: 5:03 PM-Discussed treatment plan which includes dental block and pain medication with pt at bedside and pt agreed to plan.   Labs Reviewed - No data to display No results found. No diagnosis found.  MDM  Patient with toothache.  No gross abscess.  Exam unconcerning for Ludwig's angina or spread of infection.  Will treat with penicillin and pain medicine.  Urged patient to follow-up with dentist.    I personally performed the services described in this documentation, which was scribed in my presence. The recorded information has been reviewed and is accurate.   Pascal Lux [place], PA-C 07/19/13 [phone]

## 2013-07-19 NOTE — ED Notes (Signed)
Pt c/o toothache st's left lower back tooth is broken and has been hurting x's 3 days.  Has taken OTC meds without relief.

## 2013-07-19 NOTE — ED Provider Notes (Signed)
Medical screening examination/treatment/procedure(s) were performed by non-physician practitioner and as supervising physician I was immediately available for consultation/collaboration.  Jadin Kagel, MD 07/19/13 2001 

## 2013-07-19 NOTE — ED Notes (Signed)
Patient presents to ED today with complaints of dental pain for 3 days, states he tooth is broke on the left side and left ear pain.

## 2013-08-05 ENCOUNTER — Emergency Department (HOSPITAL_COMMUNITY)
Admission: EM | Admit: 2013-08-05 | Discharge: 2013-08-05 | Disposition: A | Discharge status: Home / Self Care | Payer: Medicaid Other | Attending: Emergency Medicine | Admitting: Emergency Medicine

## 2013-08-05 ENCOUNTER — Encounter (HOSPITAL_COMMUNITY): Payer: Self-pay | Admitting: Emergency Medicine

## 2013-08-05 DIAGNOSIS — K089 Disorder of teeth and supporting structures, unspecified: Secondary | ICD-10-CM | POA: Insufficient documentation

## 2013-08-05 DIAGNOSIS — F17200 Nicotine dependence, unspecified, uncomplicated: Secondary | ICD-10-CM | POA: Insufficient documentation

## 2013-08-05 DIAGNOSIS — K029 Dental caries, unspecified: Secondary | ICD-10-CM | POA: Insufficient documentation

## 2013-08-05 DIAGNOSIS — K006 Disturbances in tooth eruption: Secondary | ICD-10-CM | POA: Insufficient documentation

## 2013-08-05 DIAGNOSIS — K0381 Cracked tooth: Secondary | ICD-10-CM | POA: Insufficient documentation

## 2013-08-05 DIAGNOSIS — K0889 Other specified disorders of teeth and supporting structures: Secondary | ICD-10-CM

## 2013-08-05 MED ORDER — HYDROCODONE-ACETAMINOPHEN 5-325 MG PO TABS
1.0000 | ORAL_TABLET | Freq: Once | ORAL | Status: AC
  Administered 2013-08-05: 1 via ORAL
  Filled 2013-08-05: qty 1

## 2013-08-05 NOTE — ED Provider Notes (Signed)
Medical screening examination/treatment/procedure(s) were performed by non-physician practitioner and as supervising physician I was immediately available for consultation/collaboration.  Mckay Tegtmeyer K Tyress Loden-Rasch, MD 08/05/13 0121 

## 2013-08-05 NOTE — ED Provider Notes (Signed)
CSN: [id number]     Arrival date & time 08/05/13  0020 History   First MD Initiated Contact with Patient 08/05/13 0040     Chief Complaint  Patient presents with  . Dental Pain   HPI  History provided by the patient. The patient is a 27 year old female who presents with complaints of continued left lower molar dental pains. She reports having intermittent pains off and on for the past one month. She has been to the emergency room previously for these symptoms. She was given a dental referral but states she was unable to make the appointment because she did not have a babysitter for her children. She has been taking Tylenol for pain but states pain tonight was too intense. She denies any recent associated swelling of the face or gums. No bleeding in the mouth. No fever, chills or sweats. No other aggravating or alleviating factors. No other associated symptoms.    History reviewed. No pertinent past medical history. History reviewed. No pertinent past surgical history. Family History  Problem Relation Age of Onset  . Cancer Father     lung   History  Substance Use Topics  . Smoking status: Current Every Day Smoker -- 0.50 packs/day    Types: Cigarettes  . Smokeless tobacco: Not on file  . Alcohol Use: No   OB History   Grav Para Term Preterm Abortions TAB SAB Ect Mult Living                 Review of Systems  Constitutional: Negative for fever and chills.  All other systems reviewed and are negative.    Allergies  Review of patient's allergies indicates no known allergies.  Home Medications   Current Outpatient Rx  Name  Route  Sig  Dispense  Refill  . acetaminophen (TYLENOL) 500 MG tablet   Oral   Take 500 mg by mouth daily as needed for pain.           BP 123/83  Pulse 79  Temp(Src) 97.9 F (36.6 C) (Oral)  Resp 18  SpO2 96% Physical Exam  Nursing note and vitals reviewed. Constitutional: She is oriented to person, place, and time. She appears  well-developed and well-nourished. No distress.  HENT:  Head: Normocephalic.  Mouth/Throat: Oropharynx is clear and moist.    No significant pain to percussion along the teeth. There is fractured in decaying molar teeth on the left lower and right lower side. The left lower third molar is partially impacted without any friability to the dome. There is no signs of large dental abscess. Face is symmetric.  Neck: Normal range of motion. Neck supple.  Cardiovascular: Normal rate and regular rhythm.   Pulmonary/Chest: Effort normal and breath sounds normal. No stridor. No respiratory distress. She has no wheezes.  Abdominal: Soft.  Musculoskeletal: Normal range of motion.  Lymphadenopathy:    She has no cervical adenopathy.  Neurological: She is alert and oriented to person, place, and time.  Skin: Skin is warm and dry. No rash noted.  Psychiatric: She has a normal mood and affect. Her behavior is normal.    ED Course  Procedures Labs Review Labs Reviewed - No data to display Imaging Review No results found.  MDM   1. Pain, dental    1:00 AM patient seen and evaluated. The patient sleeping upon entering the room. She does not appear to be in significant discomfort.    Angus Seller, PA-C 08/05/13 0110

## 2013-08-05 NOTE — ED Notes (Signed)
PT. REPORTS LEFT LOWER MOLAR PAIN ONSET THIS EVENING UNRELIEVED BY OTC TYLENOL .

## 2014-01-01 ENCOUNTER — Other Ambulatory Visit: Payer: Self-pay | Admitting: Physician Assistant

## 2014-01-01 DIAGNOSIS — N6310 Unspecified lump in the right breast, unspecified quadrant: Secondary | ICD-10-CM

## 2014-01-07 ENCOUNTER — Other Ambulatory Visit: Payer: Medicaid Other

## 2014-02-17 ENCOUNTER — Encounter (HOSPITAL_COMMUNITY): Payer: Self-pay | Admitting: Emergency Medicine

## 2014-02-17 ENCOUNTER — Emergency Department (HOSPITAL_COMMUNITY)
Admission: EM | Admit: 2014-02-17 | Discharge: 2014-02-17 | Disposition: A | Discharge status: Home / Self Care | Payer: Medicaid Other | Attending: Emergency Medicine | Admitting: Emergency Medicine

## 2014-02-17 DIAGNOSIS — Z792 Long term (current) use of antibiotics: Secondary | ICD-10-CM | POA: Insufficient documentation

## 2014-02-17 DIAGNOSIS — K029 Dental caries, unspecified: Secondary | ICD-10-CM | POA: Insufficient documentation

## 2014-02-17 DIAGNOSIS — K0381 Cracked tooth: Secondary | ICD-10-CM | POA: Insufficient documentation

## 2014-02-17 DIAGNOSIS — G479 Sleep disorder, unspecified: Secondary | ICD-10-CM | POA: Insufficient documentation

## 2014-02-17 DIAGNOSIS — K089 Disorder of teeth and supporting structures, unspecified: Secondary | ICD-10-CM | POA: Insufficient documentation

## 2014-02-17 DIAGNOSIS — K0889 Other specified disorders of teeth and supporting structures: Secondary | ICD-10-CM

## 2014-02-17 DIAGNOSIS — Z720 Tobacco use: Secondary | ICD-10-CM

## 2014-02-17 DIAGNOSIS — F17200 Nicotine dependence, unspecified, uncomplicated: Secondary | ICD-10-CM | POA: Insufficient documentation

## 2014-02-17 DIAGNOSIS — G8929 Other chronic pain: Secondary | ICD-10-CM | POA: Insufficient documentation

## 2014-02-17 DIAGNOSIS — R220 Localized swelling, mass and lump, head: Secondary | ICD-10-CM | POA: Insufficient documentation

## 2014-02-17 DIAGNOSIS — IMO0002 Reserved for concepts with insufficient information to code with codable children: Secondary | ICD-10-CM | POA: Insufficient documentation

## 2014-02-17 DIAGNOSIS — R221 Localized swelling, mass and lump, neck: Secondary | ICD-10-CM

## 2014-02-17 MED ORDER — OXYCODONE-ACETAMINOPHEN 5-325 MG PO TABS
2.0000 | ORAL_TABLET | Freq: Once | ORAL | Status: AC
  Administered 2014-02-17: 2 via ORAL
  Filled 2014-02-17: qty 2

## 2014-02-17 MED ORDER — OXYCODONE-ACETAMINOPHEN 5-325 MG PO TABS: 1.0000 | ORAL_TABLET | ORAL | Status: AC | PRN

## 2014-02-17 MED ORDER — ONDANSETRON 4 MG PO TBDP
4.0000 mg | ORAL_TABLET | Freq: Once | ORAL | Status: AC
  Administered 2014-02-17: 4 mg via ORAL
  Filled 2014-02-17: qty 1

## 2014-02-17 NOTE — ED Notes (Signed)
Pt c/o left lower tooth pain. States last saw dentist last month. States has next appt tomorrow for extraction. States began taking Hydrocodone last pm - not helping. Pt has bottles of Amox and Dexamethasone with her which she states was advised to begin after tomorrow.

## 2014-02-17 NOTE — ED Provider Notes (Signed)
Medical screening examination/treatment/procedure(s) were performed by non-physician practitioner and as supervising physician I was immediately available for consultation/collaboration.  Kupono Marling L Casimira Sutphin, MD 02/17/14 1612 

## 2014-02-17 NOTE — ED Provider Notes (Signed)
CSN: [id number]     Arrival date & time 02/17/14  0808 History   First MD Initiated Contact with Patient 02/17/14 0818     Chief Complaint  Patient presents with  . Dental Problem     (Consider location/radiation/quality/duration/timing/severity/associated sxs/prior Treatment) HPI   28 year old female with a chief complaint of dental pain.  She's had chronic pain in this tooth.  Over the past 4 days she's had increasing pain with swelling in the gum.  She denies any difficulty breathing or swallowing, denies fevers, chills, myalgias.  Patient has a history of chronic tobacco abuse.  I did counsel the patient on cessation.  He said pain is rated at a 9/10, just difficulty sleeping, radiation to the left jaw and left ear.  Worse with movement of the jaw, pressure, cold liquids.  She does have a fracture in that tooth and teary present.  Patient has followup with a dentist tomorrow.  She has been using Norco without relief. History reviewed. No pertinent past medical history. No past surgical history on file. Family History  Problem Relation Age of Onset  . Cancer Father     lung   History  Substance Use Topics  . Smoking status: Current Every Day Smoker -- 0.50 packs/day    Types: Cigarettes  . Smokeless tobacco: Not on file  . Alcohol Use: Yes     Comment: 3 beers/week   OB History   Grav Para Term Preterm Abortions TAB SAB Ect Mult Living                 Review of Systems  Constitutional: Negative for fever, chills and appetite change.  HENT: Positive for dental problem. Negative for congestion, drooling, facial swelling, mouth sores, trouble swallowing and voice change.   Musculoskeletal: Negative for neck pain.  Neurological: Negative for headaches.      Allergies  Review of patient's allergies indicates no known allergies.  Home Medications   Current Outpatient Rx  Name  Route  Sig  Dispense  Refill  . HYDROcodone-acetaminophen (NORCO) 10-325 MG per tablet    Oral   Take 1 tablet by mouth every 4 (four) hours as needed for moderate pain.         Marland Kitchen. ibuprofen (ADVIL,MOTRIN) 200 MG tablet   Oral   Take 200 mg by mouth every 6 (six) hours as needed.         Marland Kitchen. amoxicillin (AMOXIL) 500 MG capsule   Oral   Take 500 mg by mouth 3 (three) times daily.         Marland Kitchen. dexamethasone (DECADRON) 4 MG tablet   Oral   Take 4 mg by mouth 2 (two) times daily with a meal.          BP 137/84  Pulse 65  Temp(Src) 98.6 F (37 C) (Oral)  Resp 18  Wt 107 lb (48.535 kg)  SpO2 100% Physical Exam  Nursing note and vitals reviewed. Constitutional: She is oriented to person, place, and time. She appears well-developed and well-nourished. No distress.  Smell strongly of tobacco  HENT:  Head: Normocephalic and atraumatic.  Mouth/Throat: Uvula is midline, oropharynx is clear and moist and mucous membranes are normal. No trismus in the jaw. Dental caries present. No uvula swelling.    Eyes: Conjunctivae are normal. No scleral icterus.  Neck: Normal range of motion.  Cardiovascular: Normal rate, regular rhythm and normal heart sounds.  Exam reveals no gallop and no friction rub.   No murmur heard.  Pulmonary/Chest: Effort normal and breath sounds normal. No respiratory distress.  Abdominal: Soft. Bowel sounds are normal. She exhibits no distension and no mass. There is no tenderness. There is no guarding.  Lymphadenopathy:    She has no cervical adenopathy.  Neurological: She is alert and oriented to person, place, and time.  Skin: Skin is warm and dry. She is not diaphoretic.    ED Course  Procedures (including critical care time) Labs Review Labs Reviewed - No data to display Imaging Review No results found.   EKG Interpretation None      MDM   Final diagnoses:  Pain, dental  Tobacco abuse    Patient with toothache.  No gross abscess.  Exam unconcerning for Ludwig's angina or spread of infection.  Continue with amoxicillin and pain  medication.  2 Percocet quantity of 6 pain medicine.  Urged patient to follow-up with dentist.        Arthor Captain, PA-C 02/17/14 [phone]

## 2014-02-17 NOTE — Discharge Instructions (Signed)
You have been diagnosed with Dental pain. Please call the follow up dentist first thing in the morning on Monday for a follow up appointment. Keep your discharge paperwork from today's visit to bring to the dentist office. You may also use the resource guide listed below to help you find a dentist if you do not already have one to followup with. It is very important that you get evaluated by a dentist as soon as possible.  Use your pain medication as prescribed and do not operate heavy machinery while on pain medication. Note that your pain medication contains acetaminophen (Tylenol) & its is not reccommended that you use additional acetaminophen (Tylenol) while taking this medication. Take your full course of antibiotics. Read the instructions below. ° °Eat a soft or liquid diet and rinse your mouth out after meals with warm water. You should see a dentist or return here at once if you have increased swelling, increased pain or uncontrolled bleeding from the site of your injury. ° ° °SEEK MEDICAL CARE IF:  °· You have increased pain not controlled with medicines.  °· You have swelling around your tooth, in your face or neck.  °· You have bleeding which starts, continues, or gets worse.  °· You have a fever >101 °· If you are unable to open your mouth °Soft Diet  °The soft diet may be recommended after you were put on a full liquid diet. A normal diet may follow. The soft diet can also be used after surgery if you are too ill to keep down a normal diet. The soft diet may also be needed if you have a hard time chewing foods.  °DESCRIPTION  °Tender foods are used. Foods do not need to be ground or pureed. Most raw fruits and vegetables and coarse breads and cereals should be avoided. Fried foods and highly seasoned foods may cause discomfort.  °NUTRITIONAL ADEQUACY  °A healthy diet is possible if foods from each of the basic food groups are eaten daily.  °SOFT DIET FOOD LISTS  °Milk/Dairy  °Allowed: Milk and milk  drinks, milk shakes, cream cheese, cottage cheese, mild cheeses.  °Avoid: Sharp or highly seasoned cheese. °Meat/Meat Substitutes  °Allowed: Broiled, roasted, baked, or stewed tender lean beef, mutton, lamb, veal, chicken, turkey, liver, ham, crisp bacon, white fish, tuna, salmon. Eggs, smooth peanut butter.  °Avoid: All fried meats, fish, or fowl. Rich gravies and sauces. Lunch meats, sausages, hot dogs. Meats with gristle, chunky peanut butter. °Breads/Grains  °Allowed: Rice, noodles, spaghetti, macaroni. Dry or cooked refined cereals, such as farina, cream of wheat, oatmeal, grits, whole-wheat cereals. Plain or toasted white or wheat blend or whole-grain breads, soda crackers or saltines, flour tortillas.  °Avoid: Wild rice, coarse cereals, such as bran. Seed in or on breads and crackers. Bread or bread products with nuts or seeds. °Fruits/Vegetables  °Allowed: Fruit and vegetable juices, well-cooked or canned fruits and vegetables, any dried fruit. One citrus fruit daily, 1 vitamin A source daily. Well-ripened, easy to chew fruits, sweet potatoes. Baked, boiled, mashed, creamed, scalloped, or au gratin potatoes. Broths or creamed soups made with allowed vegetables, strained tomatoes.  °Avoid: All gas-forming vegetables (corn, radishes, Brussels sprouts, onions, broccoli, cabbage, parsnips, turnips, chili peppers, pinto beans, split peas, dried beans). Fruits containing seeds and skin. Potato chips and corn chips. All others that are not made with allowed vegetables. Highly seasoned soups. °Desserts/Sweets  °Allowed: Simple desserts, such as custard, junkets, gelatin desserts, plain ice cream and sherbets, simple cakes   and cookies, allowed fruits, sugar, syrup, jelly, honey, plain hard candy, and molasses.  Avoid: Rich pastries, any dessert containing dates, nuts, raisins, or coconut. Fried pastries, such as doughnuts. Chocolate. Beverages  Allowed: Fruit and vegetable juices. Caffeine-free carbonated drinks,  coffee, and tea.  Avoid: Caffeinated beverages: coffee, tea, soda or pop. Miscellaneous  Allowed: Butter, cream, margarine, mayonnaise, oil. Cream sauces, salt, and mild spices.  Avoid: Highly spiced salad dressings. Highly seasoned foods, hot sauce, mustard, horseradish, and pepper. SAMPLE MENU  Breakfast  Orange juice.  Oatmeal.  Soft cooked egg.  Toast and margarine.  2% milk.  Coffee. Lunch  Meatloaf.  Mashed potato.  Green beans.  Lemon pudding.  Bread and margarine.  Coffee. Dinner  Consomm or apricot nectar.  Chicken breast.  Rice, peas, and carrots.  Applesauce.  Bread and margarine.  2% milk. To cut the amount of fat in your diet, omit margarine and use 1% or skim milk.  NUTRIENT ANALYSIS  Calories........................1953 Kcal.  Protein.........................102 gm.  Carbohydrate...............247 gm.  Fat................................65 gm.  Cholesterol...................449 mg.  Dietary fiber.................19 gm.  Vitamin A.....................2944 RE.  Vitamin C.....................79 mg.  Niacin..........................25 mg.  Riboflavin....................2.0 mg.  Thiamin.......................1.5 mg.  Folate..........................249 mcg.  Calcium.......................1030 mg.  Phosphorus.................1782 mg.  Zinc..............................12 mg.  Iron..............................13 mg.  Sodium.........................299 mg.  Potassium....................3046 mg. Document Released: 03/05/2008 Document Revised: 02/19/2012 Document Reviewed: 03/05/2008  [facility] Patient Information 2014 [place], [place].   Nicotine Addiction Nicotine can act as both a stimulant (excites/activates) and a sedative (calms/quiets). Immediately after exposure to nicotine, there is a "kick" caused in part by the drug's stimulation of the adrenal glands and resulting discharge of adrenaline (epinephrine). The rush of adrenaline stimulates the body and  causes a sudden release of sugar. This means that smokers are always slightly hyperglycemic. Hyperglycemic means that the blood sugar is high, just like in diabetics. Nicotine also decreases the amount of insulin which helps control sugar levels in the body. There is an increase in blood pressure, breathing, and the rate of heart beats.  In addition, nicotine indirectly causes a release of dopamine in the brain that controls pleasure and motivation. A similar reaction is seen with other drugs of abuse, such as cocaine and heroin. This dopamine release is thought to cause the pleasurable sensations when smoking. In some different cases, nicotine can also create a calming effect, depending on sensitivity of the smoker's nervous system and the dose of nicotine taken. WHAT HAPPENS WHEN NICOTINE IS TAKEN FOR LONG PERIODS OF TIME?  Long-term use of nicotine results in addiction. It is difficult to stop.  Repeated use of nicotine creates tolerance. Higher doses of nicotine are needed to get the "kick." When nicotine use is stopped, withdrawal may last a month or more. Withdrawal may begin within a few hours after the last cigarette. Symptoms peak within the first few days and may lessen within a few weeks. For some people, however, symptoms may last for months or longer. Withdrawal symptoms include:   Irritability.  Craving.  Learning and attention deficits.  Sleep disturbances.  Increased appetite. Craving for tobacco may last for 6 months or longer. Many behaviors done while using nicotine can also play a part in the severity of withdrawal symptoms. For some people, the feel, smell, and sight of a cigarette and the ritual of obtaining, handling, lighting, and smoking the cigarette are closely linked with the pleasure of smoking. When stopped, they also miss the related behaviors which make the withdrawal or craving worse. While nicotine gum and patches  may lessen the drug aspects of withdrawal, cravings  often persist. WHAT ARE THE MEDICAL CONSEQUENCES OF NICOTINE USE?  Nicotine addiction accounts for one-third of all cancers. The top cancer caused by tobacco is lung cancer. Lung cancer is the number one cancer killer of both men and women.  Smoking is also associated with cancers of the:  Mouth.  Pharynx.  Larynx.  Esophagus.  Stomach.  Pancreas.  Cervix.  Kidney.  Ureter.  Bladder.  Smoking also causes lung diseases such as lasting (chronic) bronchitis and emphysema.  It worsens asthma in adults and children.  Smoking increases the risk of heart disease, including:  Stroke.  Heart attack.  Vascular disease.  Aneurysm.  Passive or secondary smoke can also increase medical risks including:  Asthma in children.  Sudden Infant Death Syndrome (SIDS).  Additionally, dropped cigarettes are the leading cause of residential fire fatalities.  Nicotine poisoning has been reported from accidental ingestion of tobacco products by children and pets. Death usually results in a few minutes from respiratory failure (when a person stops breathing) caused by paralysis. TREATMENT   Medication. Nicotine replacement medicines such as nicotine gum and the patch are used to stop smoking. These medicines gradually lower the dosage of nicotine in the body. These medicines do not contain the carbon monoxide and other toxins found in tobacco smoke.  Hypnotherapy.  Relaxation therapy.  Nicotine Anonymous (a 12-step support program). Find times and locations in your local yellow pages. Document Released: 08/02/2004 Document Revised: 02/19/2012 Document Reviewed: 12/25/2007 [facility] Patient Information 2014 [place].  RESOURCE GUIDE   Dental Problems  Dr. Grandville SilosJanna Civilis $200 dollar visit [address] [place], [place] [address]  [phone]    Patients with Medicaid: [facility]                     [facility] [id number]                                            [phone] Phone:  [phone]                                                  Phone:  [phone]  If unable to pay or uninsured, contact:  [facility] or [facility] to become qualified for the adult dental clinic.  Chronic Pain Problems Contact [facility]  [phone] Patients need to be referred by their primary care doctor.  Insufficient Money for Medicine Contact United Way:  call "211" or [facility] [phone].  No Primary Care Doctor Call [facility]  [phone] Other agencies that provide inexpensive medical care    [facility]  [phone]    [facility]  [phone]    [facility]  [phone]    [facility]  [phone]    [facility]  [phone]    [facility]  [phone]  [facility]ological [facility]  [phone] [facility]  [phone] [facility]   [phone] (emergency services [phone])  Substance Abuse Resources [facility]  [phone] [facility] [phone] [facility] [phone] [facility] [phone] Residential &  Outpatient Substance Abuse Program  [phone]  Abuse/Neglect [facility] Child Abuse Hotline 2180947778 [facility] Child Abuse Hotline 239-075-1899 (After Hours)  Emergency Shelter [facility] [phone]  Maternity Homes [facility] [place] [facility] [place] [phone] [facility] [phone]  MRSA Hotline #:   (915)103-5967    [facility] Resources  [facility]     [employer]                          [facility] Dept. [address] [place]                       [address]      [address] [place] [address]  Blondell Reveal Phone:  [phone]                                    Phone:  [phone]                 Phone:  [phone]  [facility] Phone:  [phone]  [facility] Child Abuse Hotline 720-508-0250 [phone] (After Hours)

## 2016-02-29 ENCOUNTER — Emergency Department (HOSPITAL_COMMUNITY)
Admission: EM | Admit: 2016-02-29 | Discharge: 2016-02-29 | Discharge status: Absent without leave | Payer: Medicaid Other

## 2016-02-29 ENCOUNTER — Encounter (HOSPITAL_COMMUNITY): Payer: Self-pay | Admitting: *Deleted

## 2016-02-29 ENCOUNTER — Emergency Department (HOSPITAL_COMMUNITY)
Admission: EM | Admit: 2016-02-29 | Discharge: 2016-02-29 | Disposition: A | Discharge status: Home / Self Care | Payer: Medicaid Other | Attending: Emergency Medicine | Admitting: Emergency Medicine

## 2016-02-29 DIAGNOSIS — S39012A Strain of muscle, fascia and tendon of lower back, initial encounter: Secondary | ICD-10-CM

## 2016-02-29 DIAGNOSIS — Y92410 Unspecified street and highway as the place of occurrence of the external cause: Secondary | ICD-10-CM | POA: Diagnosis not present

## 2016-02-29 DIAGNOSIS — S0993XA Unspecified injury of face, initial encounter: Secondary | ICD-10-CM | POA: Insufficient documentation

## 2016-02-29 DIAGNOSIS — M79601 Pain in right arm: Secondary | ICD-10-CM

## 2016-02-29 DIAGNOSIS — Z792 Long term (current) use of antibiotics: Secondary | ICD-10-CM | POA: Insufficient documentation

## 2016-02-29 DIAGNOSIS — Z7952 Long term (current) use of systemic steroids: Secondary | ICD-10-CM | POA: Insufficient documentation

## 2016-02-29 DIAGNOSIS — S161XXA Strain of muscle, fascia and tendon at neck level, initial encounter: Secondary | ICD-10-CM | POA: Diagnosis not present

## 2016-02-29 DIAGNOSIS — Y998 Other external cause status: Secondary | ICD-10-CM | POA: Diagnosis not present

## 2016-02-29 DIAGNOSIS — F17210 Nicotine dependence, cigarettes, uncomplicated: Secondary | ICD-10-CM | POA: Insufficient documentation

## 2016-02-29 DIAGNOSIS — Y9389 Activity, other specified: Secondary | ICD-10-CM | POA: Insufficient documentation

## 2016-02-29 DIAGNOSIS — S59901A Unspecified injury of right elbow, initial encounter: Secondary | ICD-10-CM | POA: Insufficient documentation

## 2016-02-29 DIAGNOSIS — S50811A Abrasion of right forearm, initial encounter: Secondary | ICD-10-CM | POA: Insufficient documentation

## 2016-02-29 DIAGNOSIS — S199XXA Unspecified injury of neck, initial encounter: Secondary | ICD-10-CM | POA: Diagnosis present

## 2016-02-29 MED ORDER — IBUPROFEN 800 MG PO TABS
800.0000 mg | ORAL_TABLET | Freq: Once | ORAL | Status: AC
  Administered 2016-02-29: 800 mg via ORAL
  Filled 2016-02-29: qty 1

## 2016-02-29 MED ORDER — METHOCARBAMOL 500 MG PO TABS: 500.0000 mg | ORAL_TABLET | Freq: Three times a day (TID) | ORAL | Status: AC | PRN

## 2016-02-29 MED ORDER — IBUPROFEN 800 MG PO TABS: 800.0000 mg | ORAL_TABLET | Freq: Three times a day (TID) | ORAL | Status: AC
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
## 2016-03-26 ENCOUNTER — Telehealth (HOSPITAL_BASED_OUTPATIENT_CLINIC_OR_DEPARTMENT_OTHER): Payer: Self-pay | Admitting: Emergency Medicine
